# Patient Record
Sex: Female | Born: 1935 | Race: Black or African American | Hispanic: No
Health system: Southern US, Community
[De-identification: ages and names within clinical notes are randomized; demographics above are authoritative.]

---

## 2010-01-29 ENCOUNTER — Ambulatory Visit: Payer: Self-pay | Admitting: Ophthalmology

## 2010-02-12 ENCOUNTER — Ambulatory Visit: Payer: Self-pay | Admitting: Ophthalmology

## 2010-05-07 ENCOUNTER — Ambulatory Visit: Payer: Self-pay | Admitting: Ophthalmology

## 2014-09-03 ENCOUNTER — Inpatient Hospital Stay: Payer: Self-pay | Admitting: Internal Medicine

## 2014-09-03 ENCOUNTER — Ambulatory Visit: Payer: Self-pay | Admitting: Internal Medicine

## 2014-09-03 LAB — COMPREHENSIVE METABOLIC PANEL
ALK PHOS: 110 U/L (ref 46–116)
AST: 21 U/L (ref 15–37)
Albumin: 2 g/dL — ABNORMAL LOW (ref 3.4–5.0)
Anion Gap: 11 (ref 7–16)
BUN: 82 mg/dL — AB (ref 7–18)
Bilirubin,Total: 0.3 mg/dL (ref 0.2–1.0)
CALCIUM: 8.6 mg/dL (ref 8.5–10.1)
Chloride: 95 mmol/L — ABNORMAL LOW (ref 98–107)
Co2: 27 mmol/L (ref 21–32)
Creatinine: 2.35 mg/dL — ABNORMAL HIGH (ref 0.60–1.30)
EGFR (African American): 24 — ABNORMAL LOW
EGFR (Non-African Amer.): 20 — ABNORMAL LOW
GLUCOSE: 375 mg/dL — AB (ref 65–99)
Osmolality: 306 (ref 275–301)
Potassium: 2.9 mmol/L — ABNORMAL LOW (ref 3.5–5.1)
SGPT (ALT): 18 U/L (ref 14–63)
Sodium: 133 mmol/L — ABNORMAL LOW (ref 136–145)
Total Protein: 6.8 g/dL (ref 6.4–8.2)

## 2014-09-03 LAB — CBC
HCT: 35 % — ABNORMAL LOW (ref 40.0–52.0)
HGB: 11 g/dL — AB (ref 12.0–16.0)
MCH: 26.7 pg (ref 26.0–34.0)
MCHC: 31.6 g/dL — ABNORMAL LOW (ref 32.0–36.0)
MCV: 85 fL (ref 80–100)
Platelet: 217 10*3/uL (ref 150–440)
RBC: 4.13 10*6/uL — ABNORMAL LOW (ref 4.40–5.90)
RDW: 18.2 % — AB (ref 11.5–14.5)
WBC: 6.5 10*3/uL (ref 3.8–10.6)

## 2014-09-03 LAB — TROPONIN I: TROPONIN-I: 0.1 ng/mL — AB

## 2014-09-04 LAB — BASIC METABOLIC PANEL
ANION GAP: 11 (ref 7–16)
BUN: 77 mg/dL — ABNORMAL HIGH (ref 7–18)
CALCIUM: 8.4 mg/dL — AB (ref 8.5–10.1)
CHLORIDE: 102 mmol/L (ref 98–107)
CO2: 26 mmol/L (ref 21–32)
Creatinine: 1.95 mg/dL — ABNORMAL HIGH (ref 0.60–1.30)
EGFR (African American): 30 — ABNORMAL LOW
EGFR (Non-African Amer.): 25 — ABNORMAL LOW
GLUCOSE: 226 mg/dL — AB (ref 65–99)
OSMOLALITY: 308 (ref 275–301)
POTASSIUM: 2.9 mmol/L — AB (ref 3.5–5.1)
Sodium: 139 mmol/L (ref 136–145)

## 2014-09-04 LAB — URINALYSIS, COMPLETE
BILIRUBIN, UR: NEGATIVE
Glucose,UR: 50 mg/dL (ref 0–75)
Hyaline Cast: 40
KETONE: NEGATIVE
Nitrite: NEGATIVE
PH: 5 (ref 4.5–8.0)
Protein: 30
Specific Gravity: 1.01 (ref 1.003–1.030)
Squamous Epithelial: 7
WBC UR: 248 /HPF (ref 0–5)

## 2014-09-04 LAB — CBC WITH DIFFERENTIAL/PLATELET
Basophil #: 0 10*3/uL (ref 0.0–0.1)
Basophil %: 0.3 %
EOS ABS: 0.1 10*3/uL (ref 0.0–0.7)
Eosinophil %: 1.4 %
HCT: 31.9 % — AB (ref 40.0–52.0)
HGB: 10.3 g/dL — ABNORMAL LOW (ref 12.0–16.0)
LYMPHS ABS: 0.8 10*3/uL — AB (ref 1.0–3.6)
Lymphocyte %: 13.2 %
MCH: 27.2 pg (ref 26.0–34.0)
MCHC: 32.3 g/dL (ref 32.0–36.0)
MCV: 84 fL (ref 80–100)
Monocyte #: 0.6 10*3/uL (ref 0.2–1.0)
Monocyte %: 10.2 %
NEUTROS ABS: 4.5 10*3/uL (ref 1.4–6.5)
Neutrophil %: 74.9 %
Platelet: 220 10*3/uL (ref 150–440)
RBC: 3.79 10*6/uL — AB (ref 4.40–5.90)
RDW: 18.6 % — ABNORMAL HIGH (ref 11.5–14.5)
WBC: 6.1 10*3/uL (ref 3.8–10.6)

## 2014-09-04 LAB — HEMOGLOBIN A1C: Hemoglobin A1C: 11.2 % — ABNORMAL HIGH (ref 4.2–6.3)

## 2014-09-04 LAB — TROPONIN I
TROPONIN-I: 0.1 ng/mL — AB
Troponin-I: 0.11 ng/mL — ABNORMAL HIGH

## 2014-09-05 LAB — CBC WITH DIFFERENTIAL/PLATELET
Basophil #: 0 10*3/uL (ref 0.0–0.1)
Basophil %: 0.6 %
Eosinophil #: 0.1 10*3/uL (ref 0.0–0.7)
Eosinophil %: 2.2 %
HCT: 33.1 % — ABNORMAL LOW (ref 40.0–52.0)
HGB: 10.8 g/dL — ABNORMAL LOW (ref 12.0–16.0)
Lymphocyte #: 1 10*3/uL (ref 1.0–3.6)
Lymphocyte %: 15.9 %
MCH: 27.5 pg (ref 26.0–34.0)
MCHC: 32.6 g/dL (ref 32.0–36.0)
MCV: 84 fL (ref 80–100)
MONOS PCT: 10.3 %
Monocyte #: 0.6 10*3/uL (ref 0.2–1.0)
Neutrophil #: 4.3 10*3/uL (ref 1.4–6.5)
Neutrophil %: 71 %
PLATELETS: 250 10*3/uL (ref 150–440)
RBC: 3.93 10*6/uL — ABNORMAL LOW (ref 4.40–5.90)
RDW: 18.3 % — AB (ref 11.5–14.5)
WBC: 6.1 10*3/uL (ref 3.8–10.6)

## 2014-09-05 LAB — BASIC METABOLIC PANEL
ANION GAP: 7 (ref 7–16)
BUN: 45 mg/dL — AB (ref 7–18)
CHLORIDE: 111 mmol/L — AB (ref 98–107)
CO2: 29 mmol/L (ref 21–32)
CREATININE: 1.15 mg/dL (ref 0.60–1.30)
Calcium, Total: 8.2 mg/dL — ABNORMAL LOW (ref 8.5–10.1)
EGFR (African American): 55 — ABNORMAL LOW
EGFR (Non-African Amer.): 46 — ABNORMAL LOW
Glucose: 195 mg/dL — ABNORMAL HIGH (ref 65–99)
Osmolality: 309 (ref 275–301)
POTASSIUM: 3.2 mmol/L — AB (ref 3.5–5.1)
Sodium: 147 mmol/L — ABNORMAL HIGH (ref 136–145)

## 2014-09-05 LAB — MAGNESIUM: MAGNESIUM: 2.4 mg/dL

## 2014-09-05 LAB — URINE CULTURE

## 2014-09-06 LAB — BASIC METABOLIC PANEL
ANION GAP: 7 (ref 7–16)
Anion Gap: 6 — ABNORMAL LOW (ref 7–16)
BUN: 21 mg/dL — ABNORMAL HIGH (ref 7–18)
BUN: 19 mg/dL — AB (ref 7–18)
CHLORIDE: 117 mmol/L — AB (ref 98–107)
CREATININE: 0.81 mg/dL (ref 0.60–1.30)
CREATININE: 0.95 mg/dL (ref 0.60–1.30)
Calcium, Total: 8.6 mg/dL (ref 8.5–10.1)
Calcium, Total: 8.7 mg/dL (ref 8.5–10.1)
Chloride: 113 mmol/L — ABNORMAL HIGH (ref 98–107)
Co2: 27 mmol/L (ref 21–32)
Co2: 27 mmol/L (ref 21–32)
EGFR (African American): >60
EGFR (African American): >60
EGFR (Non-African Amer.): >60
EGFR (Non-African Amer.): 57 — ABNORMAL LOW
Glucose: 101 mg/dL — ABNORMAL HIGH (ref 65–99)
Glucose: 268 mg/dL — ABNORMAL HIGH (ref 65–99)
Osmolality: 301 (ref 275–301)
Osmolality: 304 (ref 275–301)
POTASSIUM: 4 mmol/L (ref 3.5–5.1)
Potassium: 3.6 mmol/L (ref 3.5–5.1)
SODIUM: 150 mmol/L — AB (ref 136–145)
Sodium: 147 mmol/L — ABNORMAL HIGH (ref 136–145)

## 2014-09-06 LAB — CULTURE, BLOOD (SINGLE)

## 2014-09-07 LAB — BASIC METABOLIC PANEL
Anion Gap: 1 — ABNORMAL LOW (ref 7–16)
BUN: 11 mg/dL (ref 7–18)
CHLORIDE: 112 mmol/L — AB (ref 98–107)
Calcium, Total: 8.3 mg/dL — ABNORMAL LOW (ref 8.5–10.1)
Co2: 33 mmol/L — ABNORMAL HIGH (ref 21–32)
Creatinine: 0.72 mg/dL (ref 0.60–1.30)
EGFR (African American): >60
EGFR (Non-African Amer.): >60
GLUCOSE: 74 mg/dL (ref 65–99)
OSMOLALITY: 289 (ref 275–301)
POTASSIUM: 3.9 mmol/L (ref 3.5–5.1)
Sodium: 146 mmol/L — ABNORMAL HIGH (ref 136–145)

## 2014-09-09 LAB — CULTURE, BLOOD (SINGLE)

## 2014-10-02 ENCOUNTER — Ambulatory Visit
Admit: 2014-10-02 | Disposition: A | Discharge status: Home / Self Care | Payer: Self-pay | Attending: Internal Medicine | Admitting: Internal Medicine

## 2014-12-02 NOTE — H&P (Signed)
PATIENT NAME:  Kerry Romero, Kerry Romero MR#:  [id number] DATE OF BIRTH:  Jul 21, 1908  DATE OF ADMISSION:  09/03/2014  REFERRING PHYSICIAN: Coolidge Breeze, MD  PRIMARY CARE PHYSICIAN: Dr. Cyndie Mull  CHIEF COMPLAINT: Elevated blood sugar.   HISTORY OF PRESENT ILLNESS: A 81 year old [place] American female with a history of type 2 diabetes, uncomplicated; as well as hypertension, essential; presenting with elevated blood glucose. The patient is unable to provide any real meaningful information given mental status and medical condition. History obtained from daughter who is at bedside. She states that the patient has had elevated blood glucose for about a week, ranging between 300 and 500, and she saw her PCP who gave insulin today in the office and advised her to go to the hospital. Family noted that she had poor p.o. intake for the same general duration with worsening generalized weakness.  REVIEW OF SYSTEMS: Unable to obtain given patient's mental status and medical condition.   PAST MEDICAL HISTORY: Includes type 2 diabetes, non-insulin-dependent, uncomplicated; hypertension, essential.   SOCIAL HISTORY: Uses a walker for ambulation. Lives with the patient's nephew. No alcohol, tobacco, or drug usage.   FAMILY HISTORY: No known cardiovascular or pulmonary disorders.   ALLERGIES: No known drug allergies.   HOME MEDICATIONS: Include aspirin 81 mg p.o. q. daily, benazepril 40 mg p.o. q. daily, glipizide 2.5 mg 3 tablets p.o. q. daily, Norvasc 10 mg p.o. q. daily.   PHYSICAL EXAMINATION:  VITAL SIGNS: Temperature 98.8, heart rate 56, respirations 20, blood pressure 100/63, saturating 100% on room air. Weight 51.7 kg, BMI 20.9.  GENERAL: Chronically ill, frail-appearing, African American female currently in no acute distress.  HEAD: Normocephalic, atraumatic.  EYES: Pupils equal, round, reactive to right. Extraocular muscles intact. No scleral icterus.  MOUTH: Markedly dry mucosal membranes. Dentition  poor. No abscess noted. EAR, NOSE, THROAT: Clear without exudates. No external lesions.  NECK: Supple. No thyromegaly. No nodules. No JVD.  PULMONARY: Clear to auscultation bilaterally without wheeze, rales, or rhonchi. No use of accessory muscles. Good respiratory effort.  CHEST: Nontender to palpation.  CARDIOVASCULAR: S1, S2, regular rate and rhythm. No murmurs, rubs, or gallops. No edema. Pedal pulses 2+ bilaterally.  GASTROINTESTINAL: Soft, nontender, nondistended. No masses. Positive bowel sounds. No hepatosplenomegaly.  MUSCULOSKELETAL: No swelling, clubbing, or edema. Full range of motion in all extremities. NEUROLOGIC: Cranial nerves II-XII intact. No gross focal neurologic deficits. Sensation intact. Reflexes intact. SKIN: No ulceration, lesions, rashes, or cyanosis. Skin warm and dry. Turgor intact.  PSYCHIATRIC: Mood and affect within normal limits. The patient is awake, alert, oriented to person, place. Insight and judgment appear to be intact.   LABORATORY DATA: Sodium of 133, potassium 2.9, chloride 95, bicarbonate 27, BUN 82, creatinine 2.35, glucose 375, albumin of 2. Troponin 0.1. WBC 6.5, hemoglobin 11, platelets of 217,000.  ASSESSMENT AND PLAN: A 81 year old [place] American female with a history of type 2 diabetes, uncomplicated; hypertension, essential; presenting with elevated glucose, found to have acute kidney injury.  1.  Acute kidney injury, likely prerenal in etiology given history and physical. Check a urinalysis, renal ultrasound. Intravenous fluid hydration, fluid challenge. Follow urine output and renal function.  2.  Type 2 diabetes, poorly controlled. Check hemoglobin A1c with insulin sliding scale, q. 6 hour Accu-Cheks.  3.  Hypertension, essential. Hold ACE inhibitors and nephrotoxic agents. 4.  Venous thromboembolism prophylaxis with heparin subcutaneous.  CODE STATUS: Patient is full code, discussed with daughter at bedside. However, there is another  daughter who is the actual  healthcare power-of-attorney arriving shortly and may adjust code status at that time.    ____________________________ Cletis Athensavid K. Hower, MD dkh:ST D: 09/04/2014 00:50:20 ET T: 09/04/2014 01:07:54 ET JOB#: [id number]  cc: Cletis Athensavid K. Hower, MD, <Dictator> DAVID Synetta ShadowK HOWER MD ELECTRONICALLY SIGNED 09/04/2014 20:44

## 2014-12-02 NOTE — Consult Note (Signed)
   Comments   Family meeting held with nehew Raiford Noble(Rick) and daughters Ivor Messier(Cora and [place]).  Goal is for pt to return home and live with [place].  home health currently comes in for 3 1/2 hours daily.  Pt baseline is up with walker, eats 50-75 % of meals, needs help bathing, dressing and feeding self.  Mention hospice services and family would be interested if they could come longer.  Family would like to keep current services as they are at pt's home for longer periods than hospice would provide.  Will consult with CM about getting longer home health hours if possible.  All family members wish for pt to be a DNR.  Out of facility form explained and put in chart.  Electronic Signatures: Wei Poplaski, Jaquelyn BitterStephen J (NP)  (Signed 02-Feb-16 13:05)  Authored: Palliative Care Phifer, Harriett SineNancy (MD)  (Signed 02-Feb-16 20:37)  Authored: Palliative Care   Last Updated: 02-Feb-16 20:37 by Phifer, Harriett SineNancy (MD)

## 2014-12-02 NOTE — Discharge Summary (Signed)
PATIENT NAME:  Kerry Romero, Sander MR#:  [id number] DATE OF BIRTH:  1908-07-27  DATE OF ADMISSION:  09/03/2014 DATE OF DISCHARGE:  09/07/2014  PRIMARY CARE PROVIDER:  Dr. Donnie Coffinavid Eason.    ADMISSION DIAGNOSIS: Elevated blood sugar.   DISCHARGE DIAGNOSES:  1. Klebsiella bacteremia.  2. Urinary tract infection with multiple bacteria.  3. Diabetes mellitus type 2 with poor control, A1c 11.2.  4. Hypertension.  5. Advanced dementia.  6. Hypernatremia.  7. Do Not Resuscitate status.   CONSULTATIONS: Dr. Harriett SineNancy Phifer, palliative care.   PROCEDURES:  1.  Left shoulder x-ray due to left shoulder pain, showed degenerative changes, no acute bony abnormality.  2.  Ultrasound of the kidneys bilaterally shows echogenic renal parenchyma suggesting medical renal disease. Bilateral cysts.  No hydronephrosis.   HOSPITAL COURSE:  This 81 year old African-American female with history of type 2 diabetes and hypertension presents with elevated blood sugars. She is unable to provide history due to advanced dementia. History provided by her daughter. The patient has had elevated blood sugars for about a week ranging from 300-500. She saw her primary care physician in the office today and was given a 1 time dose of insulin and advised to go to the hospital. She has also had decreased p.o. intake for the past several days with worsening generalized weakness.   HOSPITAL COURSE BY PROBLEM:  1.  Klebsiella bacteremia: On admission blood cultures were obtained and grew Klebsiella pneumoniae sensitive to most antibiotics. She had been treated initially with Rocephin for UTI and was transitioned to Levaquin on discharge, both of which would cover this Klebsiella isolate. This isolate most likely originated from the urinary tract though her urine culture was fairly mixed. This infection likely explained her decreased p.o. intake and weakness prior to admission. On discharge she is in much better spirits, energy level is  improved, and she is eating more. 2.  Urinary tract infection: Cultures of the urine from admission are mixed likely with fecal flora. She is incontinent. Her UA had 248 white blood cells. Again she was treated with Rocephin initially and transitioned to Levaquin at the time of discharge.  3.  Diabetes mellitus type 2 with poor control: Her hemoglobin A1c is 11.2. She was started on insulin in hospital and is discharged on 10 units of Lantus daily. She is advised to check her blood sugars each morning. Low carbohydrate diet advised.   4.  Hypertension: Blood pressure initially was low and antihypertensives were held for the first 48 hours of this admission. Blood pressure then increased and her home blood pressure medications were restarted. She seems to be well controlled on her home regimen.  5.  Hypernatremia: This occurred during hospitalization and is most likely due to decreased p.o. intake and hydration with normal saline. Her sodium level is decreasing at the time of discharge and should be followed by her primary care physician.  6.  DNR status. Advanced age.  7.  Dementia: The patient will be followed by hospice in the outpatient setting due to failure to thrive. She will continue to also follow with her primary care physician.   DISCHARGE PHYSICAL EXAMINATION:  VITAL SIGNS: Temperature 97.5, pulse 67, respirations 17, blood pressure 171/74, oxygenation 97% on room air.  GENERAL: No acute distress.  CARDIOVASCULAR: Regular rate and rhythm. No murmurs, rubs, or gallops. No peripheral edema. Peripheral pulses are 1 +.   RESPIRATORY: Clear to auscultation bilaterally with good air movement.  PSYCHIATRIC: The patient has advanced dementia. She often does  not recognize her family members. At best she is oriented to person and family members only. She did not show any signs of anxiety or uncontrolled depression. She does cry out loudly fairly frequently, but is immediately calmed by verbal  reassurance.   LABORATORY DATA: Sodium 146, potassium 3.9, chloride 112, bicarbonate 33, BUN 11, creatinine 0.72, glucose 93. Hemoglobin A1c 11.2. LFTs normal except for albumin of 2.0. Troponin is mildly elevated at 0.1. UA positive for infection as mentioned above with 248 white blood cells. Serum white blood cell count 63, hemoglobin 10.8, platelets 250,000, MCV 84. Blood cultures with Klebsiella pneumoniae in 1 out of 2 bottles. Urine culture with multiple gram-negative rods suggestive of contamination.   DISCHARGE MEDICATIONS:  1. Benazepril 40 mg 1 tablet once a day.  2. Amlodipine 10 mg 1 tablet once a day.  3. Aspirin 81 mg 1 tablet once a day.  4. Insulin glargine 100 units per mL, 10 units subcutaneously once a day at bedtime.  5. Levaquin 250 mg oral tablet 1 tablet every 24 hours to complete a 10 day course.   The patient is advised to stop taking glipizide.   CONDITION: Guarded.   DISPOSITION: Discharged to home with hospice services.   DISCHARGE INSTRUCTIONS:   DIET: Carbohydrate-controlled diet, mechanical soft with Glucerna once a day.   ACTIVITY: As tolerated.   FOLLOWUP: Follow up within 1-2 weeks with your primary care physician.   TIME SPENT ON DISCHARGE: 40 minutes.     ____________________________ Ena Dawley. Clent Ridges, MD cpw:bu D: 09/11/2014 12:59:29 ET T: 09/11/2014 13:28:12 ET JOB#: [id number]  cc: Santina Evans P. Clent Ridges, MD, <Dictator> Dr. Donnie Coffin Ena Dawley [facility] MD ELECTRONICALLY SIGNED 09/12/2014 18:28

## 2015-02-01 DEATH — deceased

## 2016-03-31 IMAGING — CR DG SHOULDER 3+V*L*
1 series · 2 of 2 positions shown · non-contrast
Comparison: None.

CLINICAL DATA: Pain after fall.

EXAM:
DG SHOULDER 3+VIEWS LEFT

[Series 1: dxr shoulder left complete · 0.14mm/px · 2 of 2 slices shown]
[im 1/2]
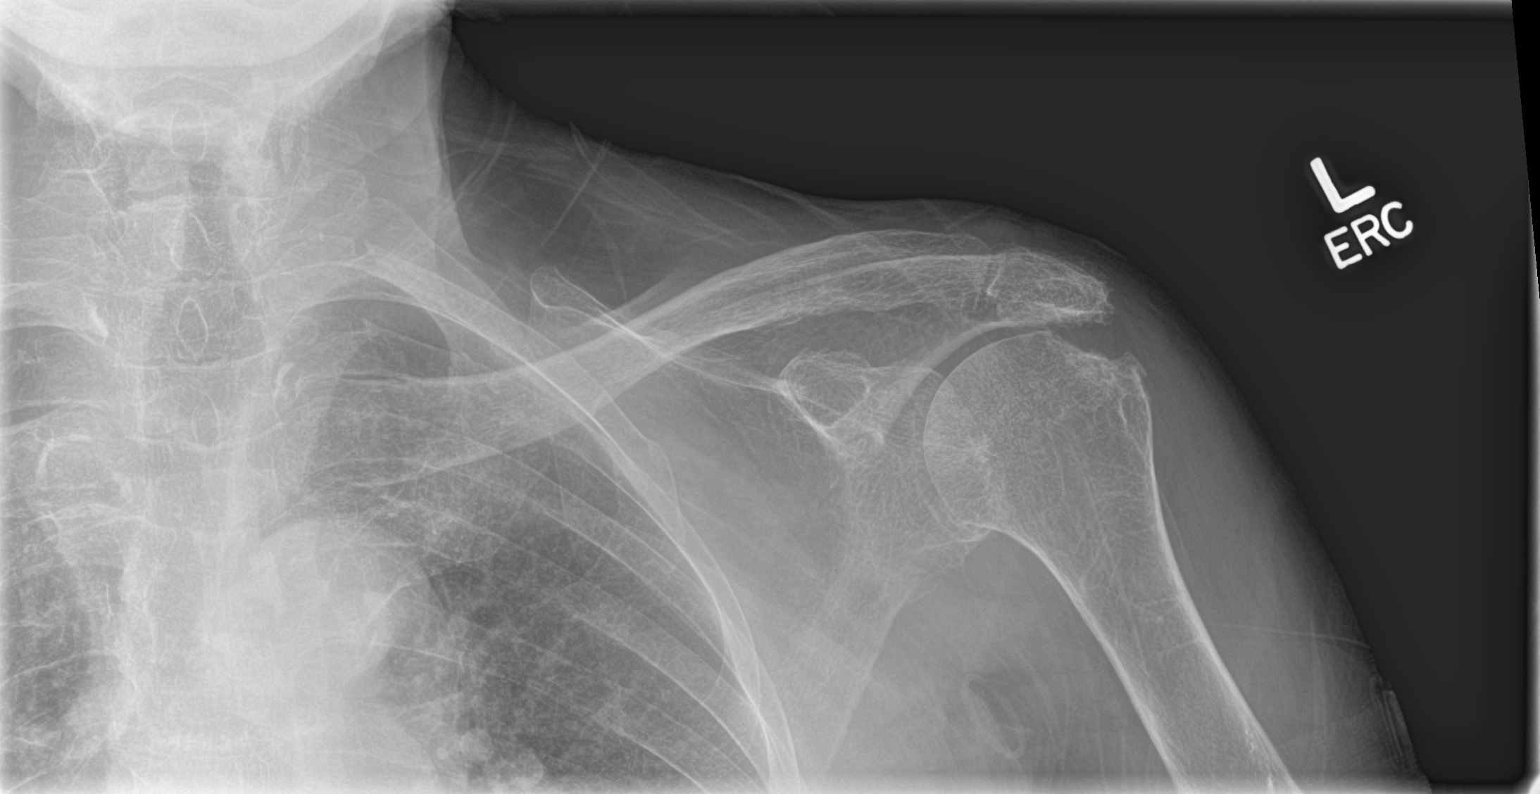
[im 2/2]
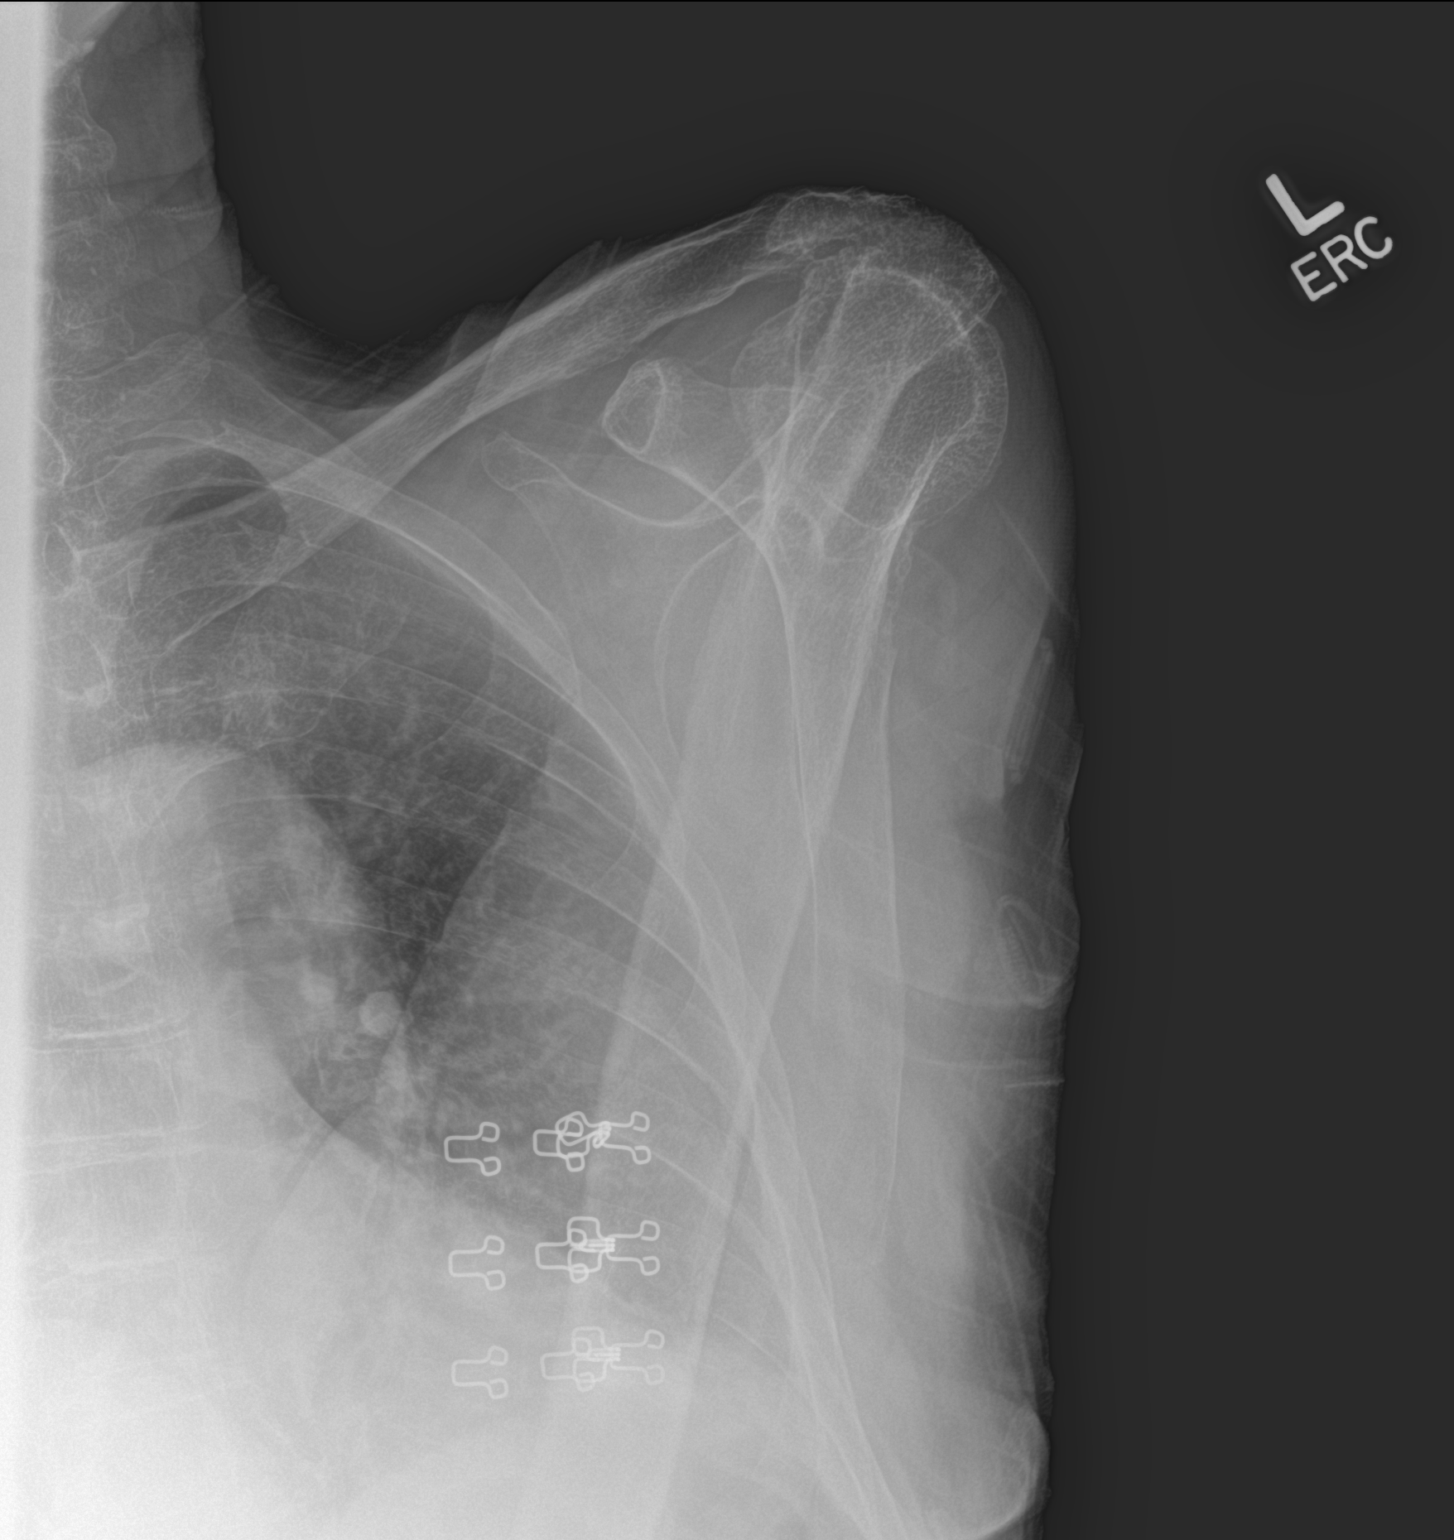

[2 of 2 positions shown; findings below may reference images not displayed]

FINDINGS: Degenerative changes in the left AC and glenohumeral joints. No
fracture, subluxation or dislocation. Soft tissues are intact.
IMPRESSION: Degenerative changes.  No acute bony abnormality.

## 2016-04-01 IMAGING — US US RENAL KIDNEY
1 series · 14 of 25 positions shown · non-contrast
Comparison: None.

CLINICAL DATA: Acute kidney injury

EXAM:
RENAL/URINARY TRACT ULTRASOUND COMPLETE

[Series 1: us renal kidney · 0.28mm/px · 14 of 33 slices shown]
[im 1/33]
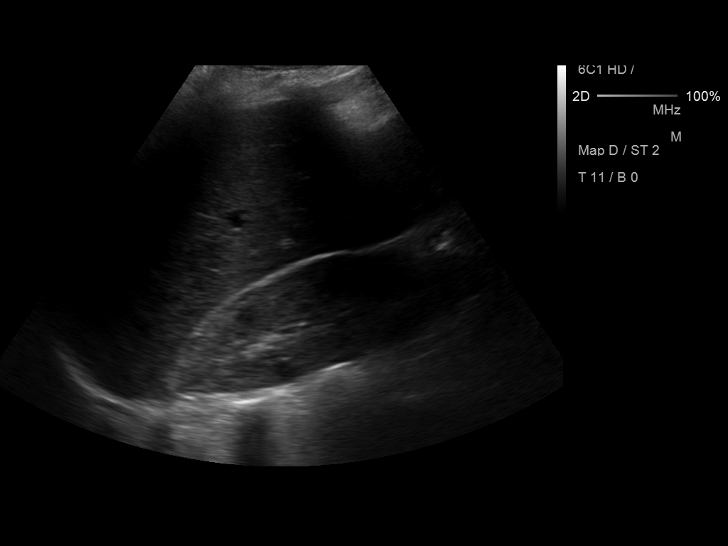
[im 3/33]
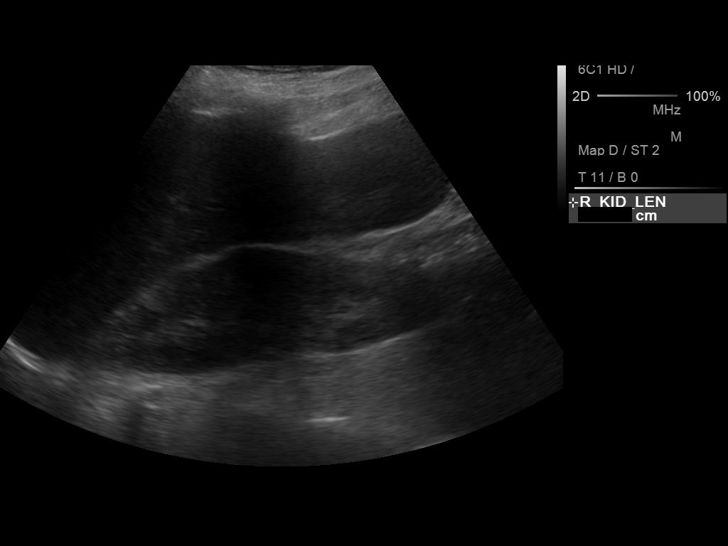
[im 6/33]
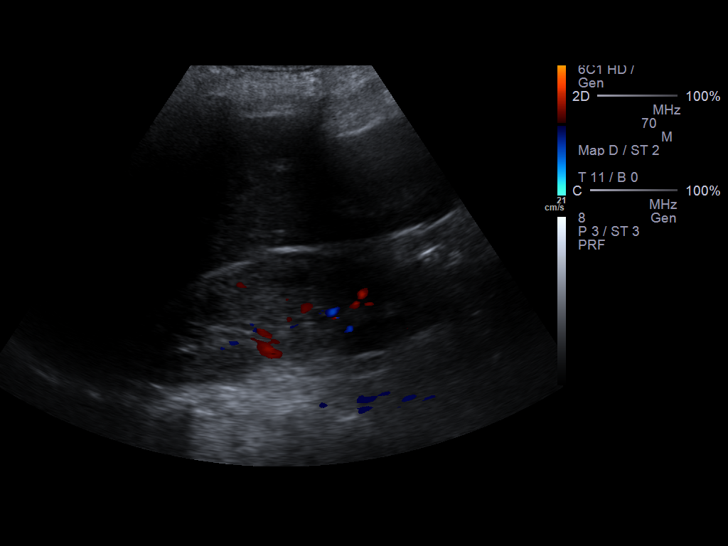
[im 9/33]
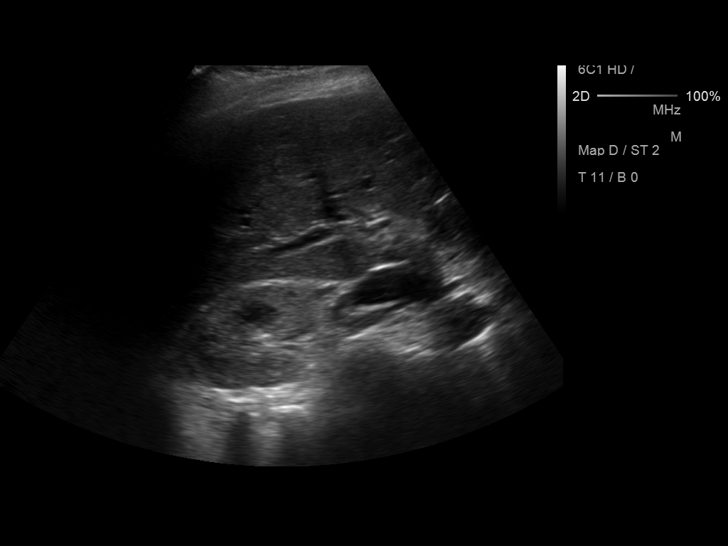
[im 11/33]
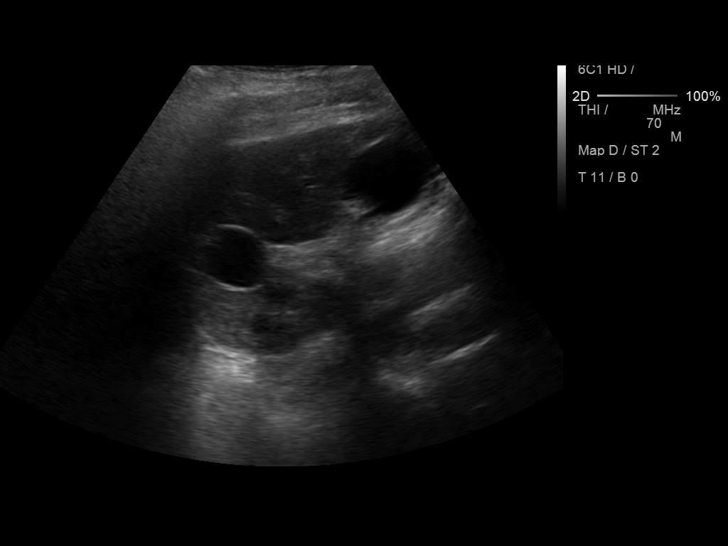
[im 13/33]
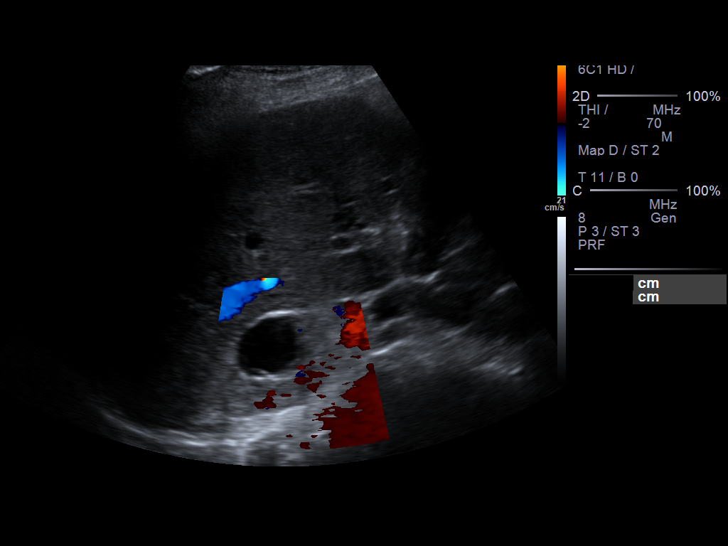
[im 15/33]
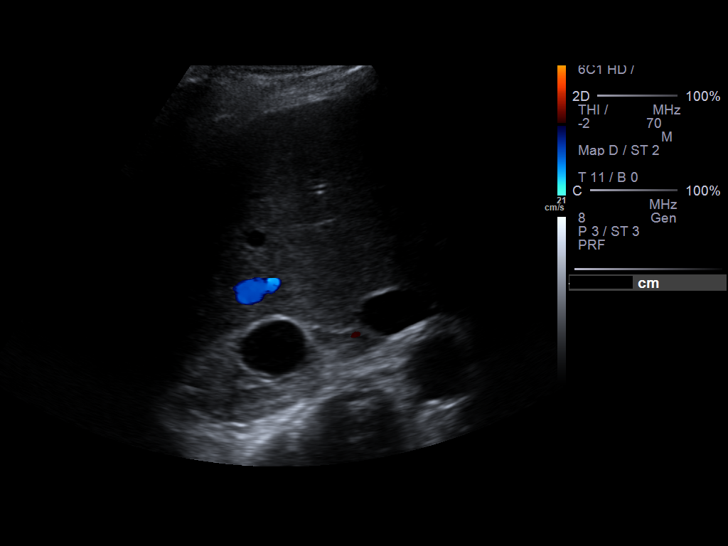
[im 18/33]
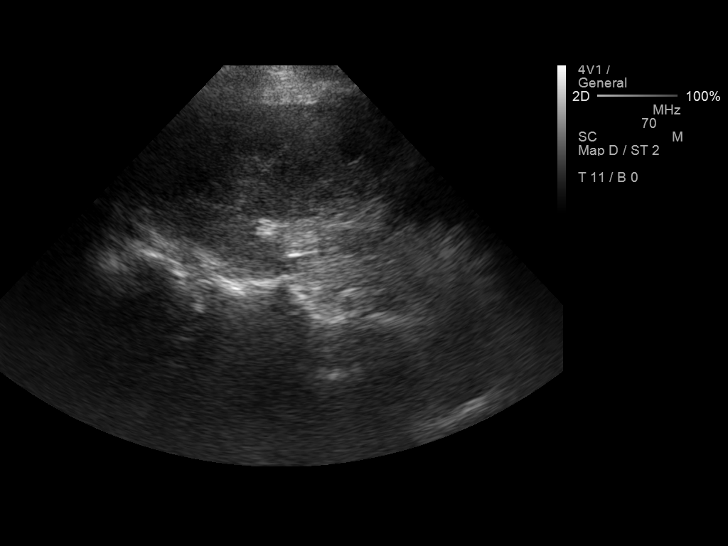
[im 21/33]
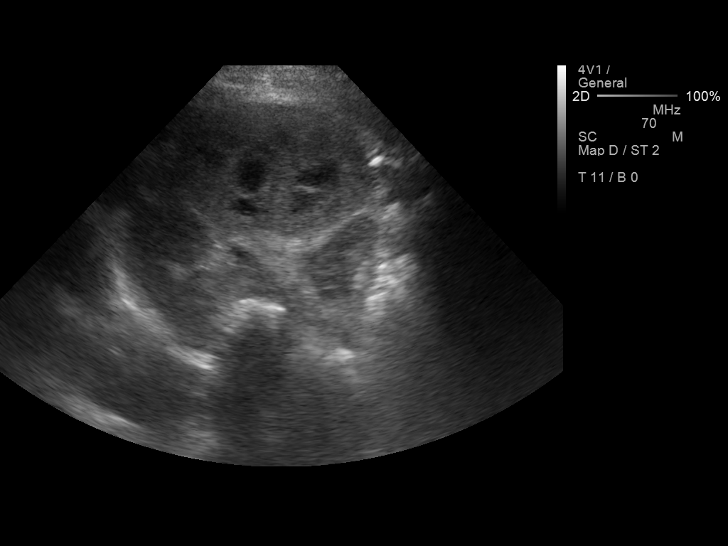
[im 22/33]
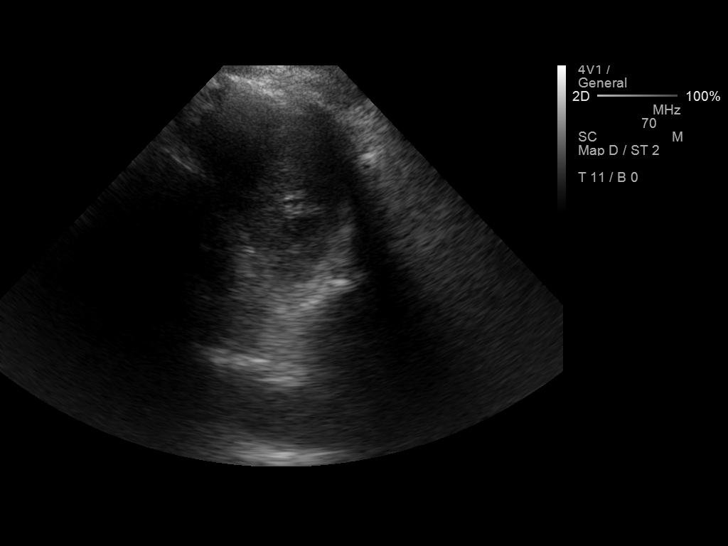
[im 25/33]
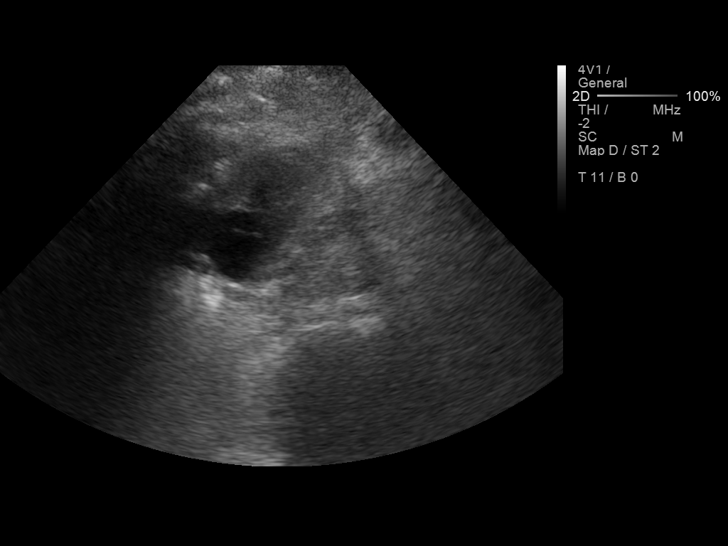
[im 27/33]
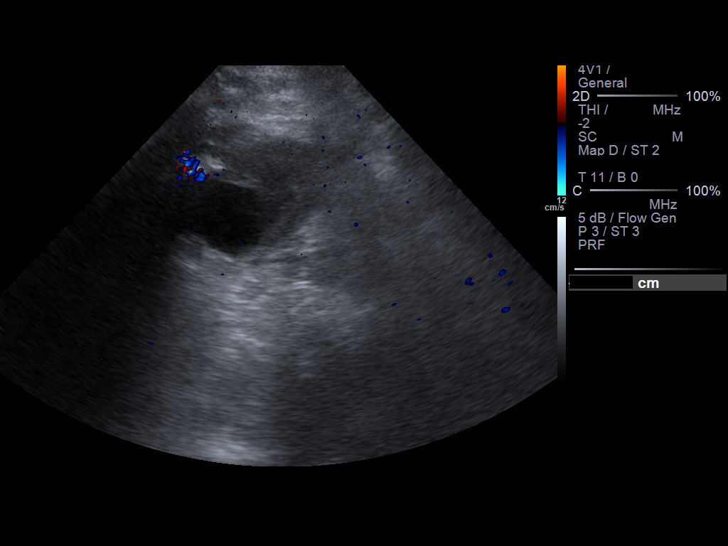
[im 30/33]
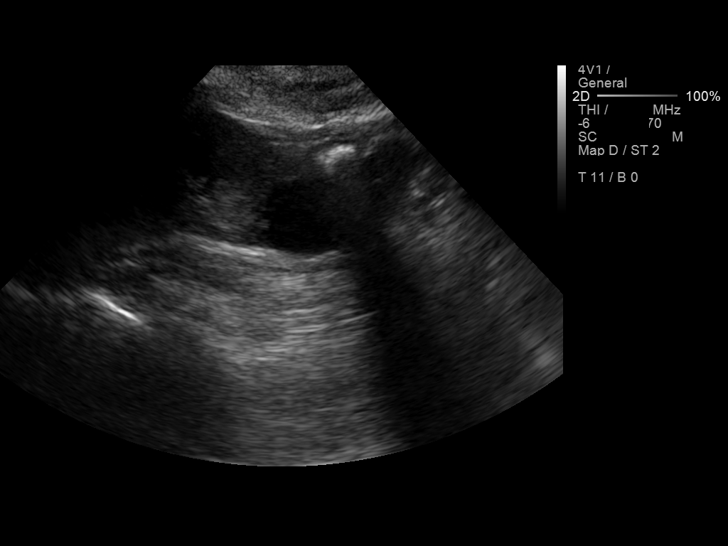
[im 33/33]
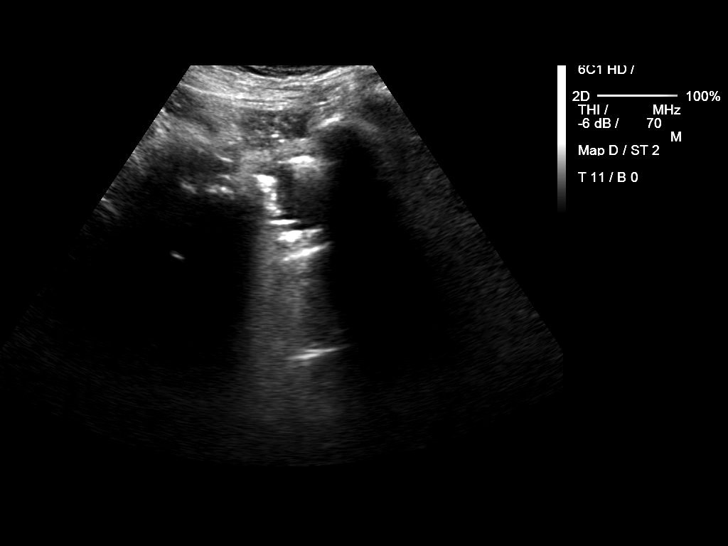

[14 of 25 positions shown; findings below may reference images not displayed]

FINDINGS: Right Kidney:

Length: 12.6 cm. Echogenic renal parenchyma. 2.9 x 2.3 x 2.8 cm
upper pole cyst. No hydronephrosis.

Left Kidney:

Length: 10.0 cm. Echogenic renal parenchyma. 2.7 x 2.4 x 2.4 cm
lower pole cyst. No hydronephrosis

Bladder:

Poorly visualized/underdistended.
IMPRESSION: Echogenic renal parenchyma, suggesting medical renal disease.

Bilateral renal cysts.

No hydronephrosis.
